# Patient Record
Sex: Female | Born: 1960 | Race: White | Hispanic: No | Smoking: Former smoker
Health system: Southern US, Community
[De-identification: ages and names within clinical notes are randomized; demographics above are authoritative.]

## PROBLEM LIST (undated history)

## (undated) DIAGNOSIS — K529 Noninfective gastroenteritis and colitis, unspecified: Secondary | ICD-10-CM

## (undated) DIAGNOSIS — K589 Irritable bowel syndrome without diarrhea: Secondary | ICD-10-CM

## (undated) HISTORY — PX: TONSILLECTOMY: SUR1361

## (undated) HISTORY — DX: Irritable bowel syndrome, unspecified: K58.9

## (undated) HISTORY — PX: TUBAL LIGATION: SHX77

## (undated) HISTORY — DX: Noninfective gastroenteritis and colitis, unspecified: K52.9

## (undated) HISTORY — PX: LAMINECTOMY: SHX219

---

## 2014-08-20 ENCOUNTER — Emergency Department (HOSPITAL_COMMUNITY): Payer: PRIVATE HEALTH INSURANCE

## 2014-08-20 ENCOUNTER — Emergency Department (HOSPITAL_COMMUNITY)
Admission: EM | Admit: 2014-08-20 | Discharge: 2014-08-20 | Disposition: A | Discharge status: Home / Self Care | Payer: PRIVATE HEALTH INSURANCE | Attending: Emergency Medicine | Admitting: Emergency Medicine

## 2014-08-20 ENCOUNTER — Encounter (HOSPITAL_COMMUNITY): Payer: Self-pay | Admitting: Emergency Medicine

## 2014-08-20 DIAGNOSIS — R21 Rash and other nonspecific skin eruption: Secondary | ICD-10-CM | POA: Diagnosis not present

## 2014-08-20 DIAGNOSIS — Z3202 Encounter for pregnancy test, result negative: Secondary | ICD-10-CM | POA: Diagnosis not present

## 2014-08-20 DIAGNOSIS — K644 Residual hemorrhoidal skin tags: Secondary | ICD-10-CM | POA: Insufficient documentation

## 2014-08-20 DIAGNOSIS — K625 Hemorrhage of anus and rectum: Secondary | ICD-10-CM | POA: Insufficient documentation

## 2014-08-20 DIAGNOSIS — R1030 Lower abdominal pain, unspecified: Secondary | ICD-10-CM

## 2014-08-20 DIAGNOSIS — K648 Other hemorrhoids: Secondary | ICD-10-CM | POA: Diagnosis not present

## 2014-08-20 DIAGNOSIS — R197 Diarrhea, unspecified: Secondary | ICD-10-CM

## 2014-08-20 DIAGNOSIS — K649 Unspecified hemorrhoids: Secondary | ICD-10-CM

## 2014-08-20 DIAGNOSIS — K863 Pseudocyst of pancreas: Secondary | ICD-10-CM | POA: Diagnosis not present

## 2014-08-20 DIAGNOSIS — R198 Other specified symptoms and signs involving the digestive system and abdomen: Secondary | ICD-10-CM | POA: Insufficient documentation

## 2014-08-20 DIAGNOSIS — R110 Nausea: Secondary | ICD-10-CM

## 2014-08-20 DIAGNOSIS — K529 Noninfective gastroenteritis and colitis, unspecified: Secondary | ICD-10-CM | POA: Insufficient documentation

## 2014-08-20 HISTORY — DX: Noninfective gastroenteritis and colitis, unspecified: K52.9

## 2014-08-20 LAB — COMPREHENSIVE METABOLIC PANEL
ALK PHOS: 73 U/L (ref 38–126)
ALT: 20 U/L (ref 14–54)
AST: 27 U/L (ref 15–41)
Albumin: 3.9 g/dL (ref 3.5–5.0)
Anion gap: 8 (ref 5–15)
BUN: 10 mg/dL (ref 6–20)
CALCIUM: 9.3 mg/dL (ref 8.9–10.3)
CO2: 29 mmol/L (ref 22–32)
Chloride: 105 mmol/L (ref 101–111)
Creatinine, Ser: 0.7 mg/dL (ref 0.44–1.00)
GFR calc non Af Amer: >60 mL/min (ref >60)
GLUCOSE: 106 mg/dL — AB (ref 65–99)
Potassium: 4.3 mmol/L (ref 3.5–5.1)
Sodium: 142 mmol/L (ref 135–145)
TOTAL PROTEIN: 6.8 g/dL (ref 6.5–8.1)
Total Bilirubin: 0.8 mg/dL (ref 0.3–1.2)

## 2014-08-20 LAB — URINALYSIS, ROUTINE W REFLEX MICROSCOPIC
Bilirubin Urine: NEGATIVE
Glucose, UA: NEGATIVE mg/dL
KETONES UR: NEGATIVE mg/dL
LEUKOCYTES UA: NEGATIVE
Nitrite: NEGATIVE
PROTEIN: NEGATIVE mg/dL
SPECIFIC GRAVITY, URINE: 1.008 (ref 1.005–1.030)
Urobilinogen, UA: 0.2 mg/dL (ref 0.0–1.0)
pH: 7 (ref 5.0–8.0)

## 2014-08-20 LAB — URINE MICROSCOPIC-ADD ON

## 2014-08-20 LAB — CBC WITH DIFFERENTIAL/PLATELET
BASOS ABS: 0 10*3/uL (ref 0.0–0.1)
BASOS PCT: 0 % (ref 0–1)
EOS ABS: 0 10*3/uL (ref 0.0–0.7)
Eosinophils Relative: 0 % (ref 0–5)
HEMATOCRIT: 42.6 % (ref 36.0–46.0)
HEMOGLOBIN: 14.6 g/dL (ref 12.0–15.0)
LYMPHS ABS: 1 10*3/uL (ref 0.7–4.0)
Lymphocytes Relative: 16 % (ref 12–46)
MCH: 33.1 pg (ref 26.0–34.0)
MCHC: 34.3 g/dL (ref 30.0–36.0)
MCV: 96.6 fL (ref 78.0–100.0)
MONO ABS: 0.3 10*3/uL (ref 0.1–1.0)
MONOS PCT: 6 % (ref 3–12)
Neutro Abs: 4.6 10*3/uL (ref 1.7–7.7)
Neutrophils Relative %: 78 % — ABNORMAL HIGH (ref 43–77)
Platelets: 212 10*3/uL (ref 150–400)
RBC: 4.41 MIL/uL (ref 3.87–5.11)
RDW: 12.6 % (ref 11.5–15.5)
WBC: 5.9 10*3/uL (ref 4.0–10.5)

## 2014-08-20 LAB — POC OCCULT BLOOD, ED: Fecal Occult Bld: POSITIVE — AB

## 2014-08-20 LAB — POC URINE PREG, ED: Preg Test, Ur: NEGATIVE

## 2014-08-20 LAB — LIPASE, BLOOD: LIPASE: 35 U/L (ref 22–51)

## 2014-08-20 MED ORDER — ONDANSETRON HCL 8 MG PO TABS: 8.0000 mg | ORAL_TABLET | Freq: Three times a day (TID) | ORAL | Status: DC | PRN

## 2014-08-20 MED ORDER — CIPROFLOXACIN HCL 500 MG PO TABS
500.0000 mg | ORAL_TABLET | Freq: Once | ORAL | Status: AC
  Administered 2014-08-20: 500 mg via ORAL
  Filled 2014-08-20: qty 1

## 2014-08-20 MED ORDER — METRONIDAZOLE 500 MG PO TABS: 500.0000 mg | ORAL_TABLET | Freq: Three times a day (TID) | ORAL | Status: AC

## 2014-08-20 MED ORDER — HYDROCODONE-ACETAMINOPHEN 5-325 MG PO TABS: 1.0000 | ORAL_TABLET | Freq: Four times a day (QID) | ORAL | Status: DC | PRN

## 2014-08-20 MED ORDER — DICYCLOMINE HCL 10 MG/ML IM SOLN: 20.0000 mg | Freq: Once | INTRAMUSCULAR | Status: DC

## 2014-08-20 MED ORDER — NAPROXEN 500 MG PO TABS: 500.0000 mg | ORAL_TABLET | Freq: Two times a day (BID) | ORAL | Status: DC | PRN

## 2014-08-20 MED ORDER — DICYCLOMINE HCL 20 MG PO TABS: 20.0000 mg | ORAL_TABLET | Freq: Two times a day (BID) | ORAL | Status: DC | PRN

## 2014-08-20 MED ORDER — CIPROFLOXACIN HCL 500 MG PO TABS: 500.0000 mg | ORAL_TABLET | Freq: Two times a day (BID) | ORAL | Status: AC

## 2014-08-20 MED ORDER — DICYCLOMINE HCL 10 MG PO CAPS
10.0000 mg | ORAL_CAPSULE | Freq: Once | ORAL | Status: AC
  Administered 2014-08-20: 10 mg via ORAL
  Filled 2014-08-20: qty 1

## 2014-08-20 MED ORDER — IOHEXOL 300 MG/ML  SOLN
100.0000 mL | Freq: Once | INTRAMUSCULAR | Status: AC | PRN
  Administered 2014-08-20: 100 mL via INTRAVENOUS

## 2014-08-20 MED ORDER — METRONIDAZOLE 500 MG PO TABS
500.0000 mg | ORAL_TABLET | Freq: Once | ORAL | Status: AC
  Administered 2014-08-20: 500 mg via ORAL
  Filled 2014-08-20: qty 1

## 2014-08-20 MED ORDER — MORPHINE SULFATE 4 MG/ML IJ SOLN
4.0000 mg | Freq: Once | INTRAMUSCULAR | Status: AC
  Administered 2014-08-20: 4 mg via INTRAVENOUS
  Filled 2014-08-20: qty 1

## 2014-08-20 MED ORDER — IOHEXOL 300 MG/ML  SOLN
25.0000 mL | Freq: Once | INTRAMUSCULAR | Status: AC | PRN
  Administered 2014-08-20: 25 mL via ORAL

## 2014-08-20 MED ORDER — SODIUM CHLORIDE 0.9 % IV BOLUS (SEPSIS)
1000.0000 mL | Freq: Once | INTRAVENOUS | Status: AC
  Administered 2014-08-20: 1000 mL via INTRAVENOUS

## 2014-08-20 MED ORDER — ONDANSETRON HCL 4 MG/2ML IJ SOLN
4.0000 mg | Freq: Once | INTRAMUSCULAR | Status: AC
  Administered 2014-08-20: 4 mg via INTRAVENOUS
  Filled 2014-08-20: qty 2

## 2014-08-20 MED ORDER — HYDROCORTISONE ACETATE 25 MG RE SUPP
25.0000 mg | Freq: Two times a day (BID) | RECTAL | Status: AC
Start: 2014-08-20 — End: ?

## 2014-08-20 NOTE — ED Notes (Signed)
NAD at this time. Pt is stable and going home.  

## 2014-08-20 NOTE — Discharge Instructions (Signed)
Your abdominal pain and diarrhea is caused by colitis, which is most likely infectious. Use zofran as prescribed, as needed for nausea. Stay well hydrated with small sips of fluids throughout the day. Follow a BRAT (banana-rice-applesauce-toast) diet as described below for the next 24-48 hours. The 'BRAT' diet is suggested, then progress to diet as tolerated as symptoms abate. Take cipro and flagyl as directed. Use norco and naprosyn as directed as needed for pain but don't drive or operate machinery while taking norco. Use anusol as directed to help with your hemorrhoids and rectal bleeding. Use bentyl as needed for abdominal spasms. For your rash, use over the counter cortisone cream as needed and avoid any known triggers. Follow up with [facility] in 1 week to establish medical care here and to follow up with your symptoms. Call your doctor if bloody stools, persistent diarrhea, vomiting, fever or abdominal pain. Return to ER for changing or worsening of symptoms.  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 Your CT results have been attached to this packet.  Food Choices to Help Relieve Diarrhea When you have diarrhea, the foods you eat and your eating habits are very important. Choosing the right foods and drinks can help relieve diarrhea. Also, because diarrhea can last up to 7 days, you need to replace lost fluids and electrolytes (such as sodium, potassium, and chloride) in order to help prevent dehydration.  WHAT GENERAL GUIDELINES DO I NEED TO FOLLOW?  Slowly drink 1 cup (8 oz) of fluid for each episode of diarrhea. If you are getting enough fluid, your urine will be clear or pale yellow.  Eat starchy foods. Some good choices include white rice, white toast, pasta, low-fiber cereal, baked potatoes (without the skin), saltine crackers, and bagels.  Avoid large servings of any cooked vegetables.  Limit fruit  to two servings per day. A serving is  cup or 1 small piece.  Choose foods with less than 2 g of fiber per serving.  Limit fats to less than 8 tsp (38 g) per day.  Avoid fried foods.  Eat foods that have probiotics in them. Probiotics can be found in certain dairy products.  Avoid foods and beverages that may increase the speed at which food moves through the stomach and intestines (gastrointestinal tract). Things to avoid include:  High-fiber foods, such as dried fruit, raw fruits and vegetables, nuts, seeds, and whole grain foods.  Spicy foods and high-fat foods.  Foods and beverages sweetened with high-fructose corn syrup, honey, or sugar alcohols such as xylitol, sorbitol, and mannitol. WHAT FOODS ARE RECOMMENDED? Grains White rice. White, [place], or pita breads (fresh or toasted), including plain rolls, buns, or bagels. White pasta. Saltine, soda, or graham crackers. Pretzels. Low-fiber cereal. Cooked cereals made with water (such as cornmeal, farina, or cream cereals). Plain muffins. Matzo. Melba toast. Zwieback.  Vegetables Potatoes (without the skin). Strained tomato and vegetable juices. Most well-cooked and canned vegetables without seeds. Tender lettuce. Fruits Cooked or canned applesauce, apricots, cherries, fruit cocktail, grapefruit, peaches, pears, or plums. Fresh bananas, apples without skin, cherries, grapes, cantaloupe, grapefruit, peaches, oranges, or plums.  Meat and Other Protein Products Baked or boiled chicken. Eggs. Tofu. Fish. Seafood. Smooth peanut butter. Ground or well-cooked tender beef, ham, veal, lamb, pork, or poultry.  Dairy Plain yogurt, kefir, and unsweetened liquid yogurt. Lactose-free milk, buttermilk, or soy milk. Plain hard cheese. Beverages  Sport drinks. Clear broths. Diluted fruit juices (except prune). Regular, caffeine-free sodas such as ginger ale. Water. Decaffeinated teas. Oral rehydration solutions. Sugar-free beverages not sweetened with  sugar alcohols. Other Bouillon, broth, or soups made from recommended foods.  The items listed above may not be a complete list of recommended foods or beverages. Contact your dietitian for more options. WHAT FOODS ARE NOT RECOMMENDED? Grains Whole grain, whole wheat, bran, or rye breads, rolls, pastas, crackers, and cereals. Wild or brown rice. Cereals that contain more than 2 g of fiber per serving. Corn tortillas or taco shells. Cooked or dry oatmeal. Granola. Popcorn. Vegetables Raw vegetables. Cabbage, broccoli, Brussels sprouts, artichokes, baked beans, beet greens, corn, kale, legumes, peas, sweet potatoes, and yams. Potato skins. Cooked spinach and cabbage. Fruits Dried fruit, including raisins and dates. Raw fruits. Stewed or dried prunes. Fresh apples with skin, apricots, mangoes, pears, raspberries, and strawberries.  Meat and Other Protein Products Chunky peanut butter. Nuts and seeds. Beans and lentils. Berniece Salines.  Dairy High-fat cheeses. Milk, chocolate milk, and beverages made with milk, such as milk shakes. Cream. Ice cream. Sweets and Desserts Sweet rolls, doughnuts, and sweet breads. Pancakes and waffles. Fats and Oils Butter. Cream sauces. Margarine. Salad oils. Plain salad dressings. Olives. Avocados.  Beverages Caffeinated beverages (such as coffee, tea, soda, or energy drinks). Alcoholic beverages. Fruit juices with pulp. Prune juice. Soft drinks sweetened with high-fructose corn syrup or sugar alcohols. Other Coconut. Hot sauce. Chili powder. Mayonnaise. Gravy. Cream-based or milk-based soups.  The items listed above may not be a complete list of foods and beverages to avoid. Contact your dietitian for more information. WHAT SHOULD I DO IF I BECOME DEHYDRATED? Diarrhea can sometimes lead to dehydration. Signs of dehydration include dark urine and dry mouth and skin. If you think you are dehydrated, you should rehydrate with an oral rehydration solution. These solutions can  be purchased at pharmacies, retail stores, or online.  Drink -1 cup (120-240 mL) of oral rehydration solution each time you have an episode of diarrhea. If drinking this amount makes your diarrhea worse, try drinking smaller amounts more often. For example, drink 1-3 tsp (5-15 mL) every 5-10 minutes.  A general rule for staying hydrated is to drink 1-2 L of fluid per day. Talk to your health care provider about the specific amount you should be drinking each day. Drink enough fluids to keep your urine clear or pale yellow. Document Released: 04/25/2003 Document Revised: 02/07/2013 Document Reviewed: 12/26/2012 [facility] Patient Information 2015 [place], [place]. This information is not intended to replace advice given to you by your health care provider. Make sure you discuss any questions you have with your health care provider.    Abdominal Pain, Women Abdominal (stomach, pelvic, or belly) pain can be caused by many things. It is important to tell your doctor:  The location of the pain.  Does it come and go or is it present all the time?  Are there things that start the pain (eating certain foods, exercise)?  Are there other symptoms associated with the pain (fever, nausea, vomiting, diarrhea)? All of this is helpful to know when trying to find the cause of the pain. CAUSES   Stomach: virus or bacteria infection, or ulcer.  Intestine: appendicitis (inflamed appendix), regional ileitis (Crohn's disease), ulcerative colitis (inflamed colon), irritable bowel syndrome, diverticulitis (inflamed diverticulum of the colon), or cancer of the stomach or intestine.  Gallbladder disease or stones in the gallbladder.  Kidney disease, kidney stones, or infection.  Pancreas infection or cancer.  Fibromyalgia (pain disorder).  Diseases of the female organs:  Uterus: fibroid (non-cancerous) tumors or infection.  Fallopian tubes: infection or tubal pregnancy.  Ovary: cysts or tumors.  Pelvic  adhesions (scar tissue).  Endometriosis (uterus lining tissue growing in the pelvis and on the pelvic organs).  Pelvic congestion syndrome (female organs filling up with blood just before the menstrual period).  Pain with the menstrual period.  Pain with ovulation (producing an egg).  Pain with an IUD (intrauterine device, birth control) in the uterus.  Cancer of the female organs.  Functional pain (pain not caused by a disease, may improve without treatment).  Psychological pain.  Depression. DIAGNOSIS  Your doctor will decide the seriousness of your pain by doing an examination.  Blood tests.  X-rays.  Ultrasound.  CT scan (computed tomography, special type of X-ray).  MRI (magnetic resonance imaging).  Cultures, for infection.  Barium enema (dye inserted in the large intestine, to better view it with X-rays).  Colonoscopy (looking in intestine with a lighted tube).  Laparoscopy (minor surgery, looking in abdomen with a lighted tube).  Major abdominal exploratory surgery (looking in abdomen with a large incision). TREATMENT  The treatment will depend on the cause of the pain.   Many cases can be observed and treated at home.  Over-the-counter medicines recommended by your caregiver.  Prescription medicine.  Antibiotics, for infection.  Birth control pills, for painful periods or for ovulation pain.  Hormone treatment, for endometriosis.  Nerve blocking injections.  Physical therapy.  Antidepressants.  Counseling with a psychologist or psychiatrist.  Minor or major surgery. HOME CARE INSTRUCTIONS   Do not take laxatives, unless directed by your caregiver.  Take over-the-counter pain medicine only if ordered by your caregiver. Do not take aspirin because it can cause an upset stomach or bleeding.  Try a clear liquid diet (broth or water) as ordered by your caregiver. Slowly move to a bland diet, as tolerated, if the pain is related to the stomach  or intestine.  Have a thermometer and take your temperature several times a day, and record it.  Bed rest and sleep, if it helps the pain.  Avoid sexual intercourse, if it causes pain.  Avoid stressful situations.  Keep your follow-up appointments and tests, as your caregiver orders.  If the pain does not go away with medicine or surgery, you may try:  Acupuncture.  Relaxation exercises (yoga, meditation).  Group therapy.  Counseling. SEEK MEDICAL CARE IF:   You notice certain foods cause stomach pain.  Your home care treatment is not helping your pain.  You need stronger pain medicine.  You want your IUD removed.  You feel faint or lightheaded.  You develop nausea and vomiting.  You develop a rash.  You are having side effects or an allergy to your medicine. SEEK IMMEDIATE MEDICAL CARE IF:   Your pain does not go away or gets worse.  You have a fever.  Your pain is felt only in portions of the abdomen. The right side could possibly be appendicitis. The left lower portion of the abdomen could be colitis or diverticulitis.  You are passing blood in your stools (bright red or black tarry stools, with or without vomiting).  You have blood in your urine.  You develop chills, with or without a fever.  You pass out. MAKE SURE YOU:   Understand these instructions.  Will watch your condition.  Will get help right away if you are not  doing well or get worse. Document Released: 11/30/2006 Document Revised: 06/19/2013 Document Reviewed: 12/20/2008 [facility] Patient Information 2015 [place], [place]. This information is not intended to replace advice given to you by your health care provider. Make sure you discuss any questions you have with your health care provider.  Colitis Colitis is inflammation of the colon. Colitis can be a short-term or long-standing (chronic) illness. Crohn's disease and ulcerative colitis are 2 types of colitis which are chronic. They  usually require lifelong treatment. CAUSES  There are many different causes of colitis, including:  Viruses.  Germs (bacteria).  Medicine reactions. SYMPTOMS   Diarrhea.  Intestinal bleeding.  Pain.  Fever.  Throwing up (vomiting).  Tiredness (fatigue).  Weight loss.  Bowel blockage. DIAGNOSIS  The diagnosis of colitis is based on examination and stool or blood tests. X-rays, CT scan, and colonoscopy may also be needed. TREATMENT  Treatment may include:  Fluids given through the vein (intravenously).  Bowel rest (nothing to eat or drink for a period of time).  Medicine for pain and diarrhea.  Medicines (antibiotics) that kill germs.  Cortisone medicines.  Surgery. HOME CARE INSTRUCTIONS   Get plenty of rest.  Drink enough water and fluids to keep your urine clear or pale yellow.  Eat a well-balanced diet.  Call your caregiver for follow-up as recommended. SEEK IMMEDIATE MEDICAL CARE IF:   You develop chills.  You have an oral temperature above 102 F (38.9 C), not controlled by medicine.  You have extreme weakness, fainting, or dehydration.  You have repeated vomiting.  You develop severe belly (abdominal) pain or are passing bloody or tarry stools. MAKE SURE YOU:   Understand these instructions.  Will watch your condition.  Will get help right away if you are not doing well or get worse. Document Released: 03/12/2004 Document Revised: 04/27/2011 Document Reviewed: 06/07/2009 [facility] Patient Information 2015 [place], [place]. This information is not intended to replace advice given to you by your health care provider. Make sure you discuss any questions you have with your health care provider.  Diarrhea Diarrhea is frequent loose and watery bowel movements. It can cause you to feel weak and dehydrated. Dehydration can cause you to become tired and thirsty, have a dry mouth, and have decreased urination that often is dark yellow. Diarrhea is a  sign of another problem, most often an infection that will not last long. In most cases, diarrhea typically lasts 2-3 days. However, it can last longer if it is a sign of something more serious. It is important to treat your diarrhea as directed by your caregiver to lessen or prevent future episodes of diarrhea. CAUSES  Some common causes include:  Gastrointestinal infections caused by viruses, bacteria, or parasites.  Food poisoning or food allergies.  Certain medicines, such as antibiotics, chemotherapy, and laxatives.  Artificial sweeteners and fructose.  Digestive disorders. HOME CARE INSTRUCTIONS  Ensure adequate fluid intake (hydration): Have 1 cup (8 oz) of fluid for each diarrhea episode. Avoid fluids that contain simple sugars or sports drinks, fruit juices, whole milk products, and sodas. Your urine should be clear or pale yellow if you are drinking enough fluids. Hydrate with an oral rehydration solution that you can purchase at pharmacies, retail stores, and online. You can prepare an oral rehydration solution at home by mixing the following ingredients together:   - tsp table salt.   tsp baking soda.   tsp salt substitute containing potassium chloride.  1  tablespoons sugar.  1 L (34  oz) of water.  Certain foods and beverages may increase the speed at which food moves through the gastrointestinal (GI) tract. These foods and beverages should be avoided and include:  Caffeinated and alcoholic beverages.  High-fiber foods, such as raw fruits and vegetables, nuts, seeds, and whole grain breads and cereals.  Foods and beverages sweetened with sugar alcohols, such as xylitol, sorbitol, and mannitol.  Some foods may be well tolerated and may help thicken stool including:  Starchy foods, such as rice, toast, pasta, low-sugar cereal, oatmeal, grits, baked potatoes, crackers, and bagels.  Bananas.  Applesauce.  Add probiotic-rich foods to help increase healthy bacteria  in the GI tract, such as yogurt and fermented milk products.  Wash your hands well after each diarrhea episode.  Only take over-the-counter or prescription medicines as directed by your caregiver.  Take a warm bath to relieve any burning or pain from frequent diarrhea episodes. SEEK IMMEDIATE MEDICAL CARE IF:   You are unable to keep fluids down.  You have persistent vomiting.  You have blood in your stool, or your stools are black and tarry.  You do not urinate in 6-8 hours, or there is only a small amount of very dark urine.  You have abdominal pain that increases or localizes.  You have weakness, dizziness, confusion, or light-headedness.  You have a severe headache.  Your diarrhea gets worse or does not get better.  You have a fever or persistent symptoms for more than 2-3 days.  You have a fever and your symptoms suddenly get worse. MAKE SURE YOU:   Understand these instructions.  Will watch your condition.  Will get help right away if you are not doing well or get worse. Document Released: 01/23/2002 Document Revised: 06/19/2013 Document Reviewed: 10/11/2011 [facility] Patient Information 2015 [place], [place]. This information is not intended to replace advice given to you by your health care provider. Make sure you discuss any questions you have with your health care provider.  Hemorrhoids Hemorrhoids are swollen veins around the rectum or anus. There are two types of hemorrhoids:   Internal hemorrhoids. These occur in the veins just inside the rectum. They may poke through to the outside and become irritated and painful.  External hemorrhoids. These occur in the veins outside the anus and can be felt as a painful swelling or hard lump near the anus. CAUSES  Pregnancy.   Obesity.   Constipation or diarrhea.   Straining to have a bowel movement.   Sitting for long periods on the toilet.  Heavy lifting or other activity that caused you to strain.  Anal  intercourse. SYMPTOMS   Pain.   Anal itching or irritation.   Rectal bleeding.   Fecal leakage.   Anal swelling.   One or more lumps around the anus.  DIAGNOSIS  Your caregiver may be able to diagnose hemorrhoids by visual examination. Other examinations or tests that may be performed include:   Examination of the rectal area with a gloved hand (digital rectal exam).   Examination of anal canal using a small tube (scope).   A blood test if you have lost a significant amount of blood.  A test to look inside the colon (sigmoidoscopy or colonoscopy). TREATMENT Most hemorrhoids can be treated at home. However, if symptoms do not seem to be getting better or if you have a lot of rectal bleeding, your caregiver may perform a procedure to help make the hemorrhoids get smaller or remove them completely. Possible treatments include:  Placing a rubber band at the base of the hemorrhoid to cut off the circulation (rubber band ligation).   Injecting a chemical to shrink the hemorrhoid (sclerotherapy).   Using a tool to burn the hemorrhoid (infrared light therapy).   Surgically removing the hemorrhoid (hemorrhoidectomy).   Stapling the hemorrhoid to block blood flow to the tissue (hemorrhoid stapling).  HOME CARE INSTRUCTIONS   Eat foods with fiber, such as whole grains, beans, nuts, fruits, and vegetables. Ask your doctor about taking products with added fiber in them (fibersupplements).  Increase fluid intake. Drink enough water and fluids to keep your urine clear or pale yellow.   Exercise regularly.   Go to the bathroom when you have the urge to have a bowel movement. Do not wait.   Avoid straining to have bowel movements.   Keep the anal area dry and clean. Use wet toilet paper or moist towelettes after a bowel movement.   Medicated creams and suppositories may be used or applied as directed.   Only take over-the-counter or prescription medicines as  directed by your caregiver.   Take warm sitz baths for 15-20 minutes, 3-4 times a day to ease pain and discomfort.   Place ice packs on the hemorrhoids if they are tender and swollen. Using ice packs between sitz baths may be helpful.   Put ice in a plastic bag.   Place a towel between your skin and the bag.   Leave the ice on for 15-20 minutes, 3-4 times a day.   Do not use a donut-shaped pillow or sit on the toilet for long periods. This increases blood pooling and pain.  SEEK MEDICAL CARE IF:  You have increasing pain and swelling that is not controlled by treatment or medicine.  You have uncontrolled bleeding.  You have difficulty or you are unable to have a bowel movement.  You have pain or inflammation outside the area of the hemorrhoids. MAKE SURE YOU:  Understand these instructions.  Will watch your condition.  Will get help right away if you are not doing well or get worse. Document Released: 01/31/2000 Document Revised: 01/20/2012 Document Reviewed: 12/08/2011 [facility] Patient Information 2015 [place], [place]. This information is not intended to replace advice given to you by your health care provider. Make sure you discuss any questions you have with your health care provider.

## 2014-08-20 NOTE — ED Notes (Signed)
Patient states ate heavy dinner last night, started having abdominal pain and then had rectal bleeding and mucous x 12 times.   Patient states has eased up, but still having some bleeding.   Patient states 5/10 abdominal pain at this time.   Patient denies fever.  Patient states also has rash on face with itching.

## 2014-08-20 NOTE — ED Provider Notes (Signed)
CSN: [id number]     Arrival date & time 08/20/14  1129 History   First MD Initiated Contact with Patient 08/20/14 1151     Chief Complaint  Patient presents with  . Rectal Bleeding  . Abdominal Pain     (Consider location/radiation/quality/duration/timing/severity/associated sxs/prior Treatment) HPI Comments: Caitlin Page is a 54 y.o. female with a PMHx of IBS, who presents to the ED with complaints of abdominal pain which is an ongoing for several days, but worsened yesterday around 7:30 PM after she ate steak shrimp potato salad and a watermelon margarita at [employer]. She reports the pain is 7/10 constant lower abdominal cramping pain intermittently radiating to the left flank, worse with the feeling of needing to have a bowel movement, and improved mildly with ibuprofen and Tylenol. Associated symptoms include mild nausea, tenesmus, 2 episodes of diarrhea, 12 episodes of mucus and bright red blood passage which is overall improving. Additionally she states that she has an itchy rash on her face, no new medications or sick contacts, and no plant or animal contacts, but she moved into a new apartment yesterday and did a deep cleaning with multiple cleaning supplies. She denies any fevers, chills, chest pain, shortness breath, vomiting, constipation, obstipation, rectal pain, dysuria, hematuria, vaginal bleeding or discharge, numbness, tingling, weakness, or lightheadedness. She denies any recent travel, sick contacts, suspicious food intake, alcohol use regularly, NSAIDs, or antibiotic use.  Patient is a 54 y.o. female presenting with hematochezia and abdominal pain. The history is provided by the patient. No language interpreter was used.  Rectal Bleeding Quality:  Bright red Amount:  Moderate Duration:  1 day Timing:  Constant Progression:  Improving Chronicity:  New Context: defecation, diarrhea and spontaneously   Context: not rectal pain   Relieved by:  None tried Worsened  by:  Nothing tried Ineffective treatments:  None tried Associated symptoms: abdominal pain   Associated symptoms: no dizziness, no fever, no light-headedness and no vomiting   Risk factors: no NSAID use   Risk factors comment:  +hx of IBS Abdominal Pain Associated symptoms: hematochezia and nausea   Associated symptoms: no chest pain, no chills, no constipation, no diarrhea, no dysuria, no fever, no hematuria, no shortness of breath, no vaginal bleeding, no vaginal discharge and no vomiting     History reviewed. No pertinent past medical history. History reviewed. No pertinent past surgical history. No family history on file. History  Substance Use Topics  . Smoking status: Former Research scientist (life sciences)  . Smokeless tobacco: Not on file  . Alcohol Use: Yes   OB History    No data available     Review of Systems  Constitutional: Negative for fever and chills.  Respiratory: Negative for shortness of breath.   Cardiovascular: Negative for chest pain.  Gastrointestinal: Positive for nausea, abdominal pain, blood in stool, hematochezia and anal bleeding. Negative for vomiting, diarrhea, constipation and rectal pain.       +tenesmus  Genitourinary: Positive for flank pain. Negative for dysuria, hematuria, vaginal bleeding and vaginal discharge.  Musculoskeletal: Negative for myalgias and arthralgias.  Skin: Positive for rash.  Allergic/Immunologic: Negative for immunocompromised state.  Neurological: Negative for dizziness, weakness, light-headedness and numbness.  Psychiatric/Behavioral: Negative for confusion.   10 Systems reviewed and are negative for acute change except as noted in the HPI.    Allergies  Compazine  Home Medications   Prior to Admission medications   Not on File   BP 127/63 mmHg  Pulse 85  Temp(Src) 98.5  F (36.9 C) (Oral)  Resp 18  Ht 5\' 11"  (1.803 m)  Wt 152 lb (68.947 kg)  BMI 21.21 kg/m2  SpO2 96% Physical Exam  Constitutional: She is oriented to person,  place, and time. Vital signs are normal. She appears well-developed and well-nourished.  Non-toxic appearance. No distress.  Afebrile, nontoxic, NAD  HENT:  Head: Normocephalic and atraumatic.  Mouth/Throat: Oropharynx is clear and moist and mucous membranes are normal.  Eyes: Conjunctivae and EOM are normal. Right eye exhibits no discharge. Left eye exhibits no discharge.  Neck: Normal range of motion. Neck supple.  Cardiovascular: Normal rate, regular rhythm, normal heart sounds and intact distal pulses.  Exam reveals no gallop and no friction rub.   No murmur heard. Pulmonary/Chest: Effort normal and breath sounds normal. No respiratory distress. She has no decreased breath sounds. She has no wheezes. She has no rhonchi. She has no rales.  Abdominal: Soft. Normal appearance and bowel sounds are normal. She exhibits no distension. There is tenderness in the right lower quadrant, suprapubic area and left lower quadrant. There is tenderness at McBurney's point. There is no rigidity, no rebound, no guarding, no CVA tenderness and negative Murphy's sign.    Soft, nondistended, +BS throughout, with diffuse lower abdominal tenderness including over mcburney's point, no r/g/r, neg murphy's, no CVA TTP   Genitourinary: Rectal exam shows external hemorrhoid, internal hemorrhoid and tenderness. Rectal exam shows no fissure, no mass and anal tone normal. Guaiac positive stool. Pelvic exam was performed with patient in the knee-chest position.  Chaperone present No gross blood noted on rectal exam, no stool in rectal vault, normal tone, mild tenderness anteriorly in rectal vault, no mass or fissure, old ext hemorrhoidal skin tag, palpable internal hemorrhoid. FOBT+   Musculoskeletal: Normal range of motion.  Neurological: She is alert and oriented to person, place, and time. She has normal strength. No sensory deficit.  Skin: Skin is warm, dry and intact. Rash noted. Rash is urticarial.  Fine urticarial  rash across face, mildly erythematous without warmth. No drainage or induration.   Psychiatric: She has a normal mood and affect.  Nursing note and vitals reviewed.   ED Course  Procedures (including critical care time) Labs Review Labs Reviewed  CBC WITH DIFFERENTIAL/PLATELET - Abnormal; Notable for the following:    Neutrophils Relative % 78 (*)    All other components within normal limits  COMPREHENSIVE METABOLIC PANEL - Abnormal; Notable for the following:    Glucose, Bld 106 (*)    All other components within normal limits  URINALYSIS, ROUTINE W REFLEX MICROSCOPIC (NOT AT [facility]) - Abnormal; Notable for the following:    Hgb urine dipstick TRACE (*)    All other components within normal limits  URINE MICROSCOPIC-ADD ON - Abnormal; Notable for the following:    Squamous Epithelial / LPF FEW (*)    All other components within normal limits  POC OCCULT BLOOD, ED - Abnormal; Notable for the following:    Fecal Occult Bld POSITIVE (*)    All other components within normal limits  LIPASE, BLOOD  POC URINE PREG, ED    Imaging Review Ct Abdomen Pelvis W Contrast  08/20/2014   CLINICAL DATA:  Lower abdominal pain and rectal bleeding beginning last night.  EXAM: CT ABDOMEN AND PELVIS WITH CONTRAST  TECHNIQUE: Multidetector CT imaging of the abdomen and pelvis was performed using the standard protocol following bolus administration of intravenous contrast.  CONTRAST:  163mL OMNIPAQUE IOHEXOL 300 MG/ML  SOLN  COMPARISON:  None.  FINDINGS: There is an 8 mm rounded low-density focus within the posterior segment the right lobe of the liver likely to represent a cyst. This was not fully imaged on the delayed scan. No other liver finding. The spleen is normal. The pancreas shows a pancreas divisum ductal pattern. The ductal system is slightly prominent, suggesting previous pancreatitis. Within the distal body, there is a 6 mm low-density that probably represents a small pseudocyst. Follow-up in 1 year  with MRI is recommended to ensure this is not enlarging, possibly representing a cystic pancreatic neoplasm.  The adrenal glands are normal. The left kidney is normal. The right kidney is incompletely rotated. No cyst, mass, stone or hydronephrosis. There are a few phleboliths in retroperitoneal veins on the right, not significant. The appendix is normal. The aorta and IVC are normal. The right colon and transverse colon are normal. In the descending colon, there is mild wall thickening extending over 8-10 cm. This suggests colitis and could be due to inflammatory bowel disease or infection. The uterus is normal. No adnexal lesion. No bladder abnormality. Previous lumbosacral fusion has a good appearance.  IMPRESSION: Wall thickening of the descending colon over a length of 8-10 cm consistent with colitis. This could be due to infection or inflammatory bowel disease.  Prominence of the pancreatic ductal system. Pancreas divisum. Question history of pancreatitis previously. 6 mm low-density in the distal pancreatic body that probably represents a pseudocyst. MRI follow-up in 1 year is recommended to ensure that this does not represent an enlarging cystic neoplasm.   Electronically Signed   By: Nelson Chimes M.D.   On: 08/20/2014 14:19     EKG Interpretation None      MDM   Final diagnoses:  Lower abdominal pain  Hemorrhoids, unspecified hemorrhoid type  Rectal bleeding  Colitis  Diarrhea  Tenesmus (rectal)  Nausea  Facial rash  Pseudocyst of pancreas    54 y.o. female here with lower abd pain for several days acutely worsening last night after a heavy meal. Associated symptoms of diarrhea x2 and rectal bleeding/mucous passage x12 episodes, improving now but still ongoing. Some nausea and tenesmus. Hx of IBS, used to be on levsin. Could be IBS vs diverticulitis vs rectal abscess. On exam, external hemorrhoidal tags, palpable internal hemorrhoid, no stool in rectal vault but FOBT card sent. Will  get labs and give fluids, nausea meds, pain meds, and bentyl. Will reassess shortly.   2:03 PM Upreg neg, U/A with few squamous and rare bacteria, nitrite/leuk neg. FOBT+. CBC w/diff unremarkable. CMP unremarkable. Lipase WNL. Awaiting CT. Will reassess shortly.  2:47 PM CT with wall thickening of descending colon c/w colitis. Also showing 13mm pseudocyst and pancreas divisum, pt without hx of pancreatitis to her knowledge, discussed these findings and advised f/up within 3yr for MRI as recommended per the radiologist. Pain improving, nausea improving. Will PO challenge with cipro/flagyl and then d/c home with cipro/flagyl, zofran, pain meds, bentyl, and anusol for her hemorrhoids. Discussed BRAT diet and f/up with [place] in 1wk to establish care and recheck symptoms.   3:35 PM Tolerating PO well after ~41mins from being given cipro/flagyl. Will d/c home with previously discussed plan. I explained the diagnosis and have given explicit precautions to return to the ER including for any other new or worsening symptoms. The patient understands and accepts the medical plan as it's been dictated and I have answered their questions. Discharge instructions concerning home care and prescriptions have been given. The  patient is STABLE and is discharged to home in good condition.  BP 109/60 mmHg  Pulse 69  Temp(Src) 98.5 F (36.9 C) (Oral)  Resp 16  Ht 5\' 11"  (1.803 m)  Wt 152 lb (68.947 kg)  BMI 21.21 kg/m2  SpO2 99%  LMP  (LMP Unknown)  Meds ordered this encounter  Medications  . sodium chloride 0.9 % bolus 1,000 mL    Sig:   . ondansetron (ZOFRAN) injection 4 mg    Sig:   . morphine 4 MG/ML injection 4 mg    Sig:   . iohexol (OMNIPAQUE) 300 MG/ML solution 25 mL    Sig:   . dicyclomine (BENTYL) capsule 10 mg    Sig:   . iohexol (OMNIPAQUE) 300 MG/ML solution 100 mL    Sig:   . ciprofloxacin (CIPRO) tablet 500 mg    Sig:   . metroNIDAZOLE (FLAGYL) tablet 500 mg    Sig:   . ciprofloxacin  (CIPRO) 500 MG tablet    Sig: Take 1 tablet (500 mg total) by mouth 2 (two) times daily. One po bid x 7 days    Dispense:  14 tablet    Refill:  0    Order Specific Question:  Supervising Provider    Answer:  Sabra Heck, BRIAN [[id number]]  . metroNIDAZOLE (FLAGYL) 500 MG tablet    Sig: Take 1 tablet (500 mg total) by mouth 3 (three) times daily. One po tid x 7 days    Dispense:  21 tablet    Refill:  0    Order Specific Question:  Supervising Provider    Answer:  Sabra Heck, BRIAN [[id number]]  . ondansetron (ZOFRAN) 8 MG tablet    Sig: Take 1 tablet (8 mg total) by mouth every 8 (eight) hours as needed for nausea or vomiting.    Dispense:  10 tablet    Refill:  0    Order Specific Question:  Supervising Provider    Answer:  Sabra Heck, BRIAN [[id number]]  . HYDROcodone-acetaminophen (NORCO) 5-325 MG per tablet    Sig: Take 1 tablet by mouth every 6 (six) hours as needed for severe pain.    Dispense:  20 tablet    Refill:  0    Order Specific Question:  Supervising Provider    Answer:  MILLER, BRIAN [[id number]]  . naproxen (NAPROSYN) 500 MG tablet    Sig: Take 1 tablet (500 mg total) by mouth 2 (two) times daily as needed for mild pain, moderate pain or headache (TAKE WITH MEALS.).    Dispense:  20 tablet    Refill:  0    Order Specific Question:  Supervising Provider    Answer:  MILLER, BRIAN [[id number]]  . hydrocortisone (ANUSOL-HC) 25 MG suppository    Sig: Place 1 suppository (25 mg total) rectally 2 (two) times daily. For 7 days    Dispense:  14 suppository    Refill:  0    Order Specific Question:  Supervising Provider    Answer:  MILLER, BRIAN [[id number]]  . dicyclomine (BENTYL) 20 MG tablet    Sig: Take 1 tablet (20 mg total) by mouth 2 (two) times daily as needed for spasms.    Dispense:  14 tablet    Refill:  0    Order Specific Question:  Supervising Provider    Answer:  Noemi Chapel [id number] [place], PA-C 08/20/14 1535  Orlie Dakin, MD 08/20/14 1737

## 2014-08-31 ENCOUNTER — Encounter: Payer: Self-pay | Admitting: Internal Medicine

## 2015-03-08 ENCOUNTER — Other Ambulatory Visit: Payer: Self-pay | Admitting: Gastroenterology

## 2015-03-08 DIAGNOSIS — K862 Cyst of pancreas: Secondary | ICD-10-CM

## 2015-03-12 DIAGNOSIS — Z1329 Encounter for screening for other suspected endocrine disorder: Secondary | ICD-10-CM | POA: Diagnosis not present

## 2015-03-12 DIAGNOSIS — Z13220 Encounter for screening for lipoid disorders: Secondary | ICD-10-CM | POA: Diagnosis not present

## 2015-03-12 DIAGNOSIS — Z13820 Encounter for screening for osteoporosis: Secondary | ICD-10-CM | POA: Diagnosis not present

## 2015-03-12 DIAGNOSIS — Z118 Encounter for screening for other infectious and parasitic diseases: Secondary | ICD-10-CM | POA: Diagnosis not present

## 2015-03-12 DIAGNOSIS — Z1231 Encounter for screening mammogram for malignant neoplasm of breast: Secondary | ICD-10-CM | POA: Diagnosis not present

## 2015-03-12 DIAGNOSIS — Z1321 Encounter for screening for nutritional disorder: Secondary | ICD-10-CM | POA: Diagnosis not present

## 2015-03-15 ENCOUNTER — Ambulatory Visit
Admission: RE | Admit: 2015-03-15 | Discharge: 2015-03-15 | Disposition: A | Discharge status: Home / Self Care | Payer: 59 | Source: Ambulatory Visit | Attending: Gastroenterology | Admitting: Gastroenterology

## 2015-03-15 DIAGNOSIS — K862 Cyst of pancreas: Secondary | ICD-10-CM | POA: Diagnosis not present

## 2015-03-15 MED ORDER — GADOBENATE DIMEGLUMINE 529 MG/ML IV SOLN
14.0000 mL | Freq: Once | INTRAVENOUS | Status: AC | PRN
  Administered 2015-03-15: 14 mL via INTRAVENOUS

## 2015-04-05 ENCOUNTER — Other Ambulatory Visit: Payer: Self-pay | Admitting: Gastroenterology

## 2015-04-05 DIAGNOSIS — Z86010 Personal history of colonic polyps: Secondary | ICD-10-CM | POA: Diagnosis not present

## 2015-04-05 DIAGNOSIS — D120 Benign neoplasm of cecum: Secondary | ICD-10-CM | POA: Diagnosis not present

## 2015-04-05 DIAGNOSIS — K644 Residual hemorrhoidal skin tags: Secondary | ICD-10-CM | POA: Diagnosis not present

## 2015-04-05 DIAGNOSIS — D123 Benign neoplasm of transverse colon: Secondary | ICD-10-CM | POA: Diagnosis not present

## 2015-04-05 DIAGNOSIS — R933 Abnormal findings on diagnostic imaging of other parts of digestive tract: Secondary | ICD-10-CM | POA: Diagnosis not present

## 2015-04-05 DIAGNOSIS — D124 Benign neoplasm of descending colon: Secondary | ICD-10-CM | POA: Diagnosis not present

## 2015-04-15 DIAGNOSIS — Z86010 Personal history of colonic polyps: Secondary | ICD-10-CM | POA: Diagnosis not present

## 2015-04-15 DIAGNOSIS — K639 Disease of intestine, unspecified: Secondary | ICD-10-CM | POA: Diagnosis not present

## 2015-04-15 DIAGNOSIS — K589 Irritable bowel syndrome without diarrhea: Secondary | ICD-10-CM | POA: Diagnosis not present

## 2015-04-15 DIAGNOSIS — K862 Cyst of pancreas: Secondary | ICD-10-CM | POA: Diagnosis not present

## 2015-05-07 DIAGNOSIS — M25511 Pain in right shoulder: Secondary | ICD-10-CM | POA: Diagnosis not present

## 2015-05-07 DIAGNOSIS — M7551 Bursitis of right shoulder: Secondary | ICD-10-CM | POA: Diagnosis not present

## 2015-06-25 DIAGNOSIS — Z113 Encounter for screening for infections with a predominantly sexual mode of transmission: Secondary | ICD-10-CM | POA: Diagnosis not present

## 2015-07-26 DIAGNOSIS — F3289 Other specified depressive episodes: Secondary | ICD-10-CM | POA: Diagnosis not present

## 2015-07-31 DIAGNOSIS — F3289 Other specified depressive episodes: Secondary | ICD-10-CM | POA: Diagnosis not present

## 2015-08-16 DIAGNOSIS — F3289 Other specified depressive episodes: Secondary | ICD-10-CM | POA: Diagnosis not present

## 2015-09-10 DIAGNOSIS — F3289 Other specified depressive episodes: Secondary | ICD-10-CM | POA: Diagnosis not present

## 2015-09-29 ENCOUNTER — Emergency Department (HOSPITAL_COMMUNITY)
Admission: EM | Admit: 2015-09-29 | Discharge: 2015-09-29 | Disposition: A | Discharge status: Home / Self Care | Payer: 59 | Attending: Emergency Medicine | Admitting: Emergency Medicine

## 2015-09-29 ENCOUNTER — Encounter (HOSPITAL_COMMUNITY): Payer: Self-pay | Admitting: Emergency Medicine

## 2015-09-29 ENCOUNTER — Emergency Department (HOSPITAL_COMMUNITY): Payer: 59

## 2015-09-29 DIAGNOSIS — R1013 Epigastric pain: Secondary | ICD-10-CM | POA: Diagnosis not present

## 2015-09-29 DIAGNOSIS — K2970 Gastritis, unspecified, without bleeding: Secondary | ICD-10-CM | POA: Diagnosis not present

## 2015-09-29 DIAGNOSIS — Z87891 Personal history of nicotine dependence: Secondary | ICD-10-CM | POA: Insufficient documentation

## 2015-09-29 DIAGNOSIS — Z79899 Other long term (current) drug therapy: Secondary | ICD-10-CM | POA: Insufficient documentation

## 2015-09-29 DIAGNOSIS — R109 Unspecified abdominal pain: Secondary | ICD-10-CM | POA: Diagnosis not present

## 2015-09-29 DIAGNOSIS — R1111 Vomiting without nausea: Secondary | ICD-10-CM | POA: Diagnosis not present

## 2015-09-29 DIAGNOSIS — R51 Headache: Secondary | ICD-10-CM | POA: Diagnosis not present

## 2015-09-29 DIAGNOSIS — M542 Cervicalgia: Secondary | ICD-10-CM | POA: Insufficient documentation

## 2015-09-29 LAB — CBC WITH DIFFERENTIAL/PLATELET
BASOS ABS: 0 10*3/uL (ref 0.0–0.1)
BASOS PCT: 0 %
EOS ABS: 0 10*3/uL (ref 0.0–0.7)
EOS PCT: 0 %
HCT: 42.8 % (ref 36.0–46.0)
Hemoglobin: 14.4 g/dL (ref 12.0–15.0)
Lymphocytes Relative: 11 %
Lymphs Abs: 0.6 10*3/uL — ABNORMAL LOW (ref 0.7–4.0)
MCH: 32.4 pg (ref 26.0–34.0)
MCHC: 33.6 g/dL (ref 30.0–36.0)
MCV: 96.4 fL (ref 78.0–100.0)
MONO ABS: 0.1 10*3/uL (ref 0.1–1.0)
Monocytes Relative: 3 %
Neutro Abs: 4.2 10*3/uL (ref 1.7–7.7)
Neutrophils Relative %: 86 %
PLATELETS: 212 10*3/uL (ref 150–400)
RBC: 4.44 MIL/uL (ref 3.87–5.11)
RDW: 12.4 % (ref 11.5–15.5)
WBC: 5 10*3/uL (ref 4.0–10.5)

## 2015-09-29 LAB — COMPREHENSIVE METABOLIC PANEL
ALBUMIN: 4.1 g/dL (ref 3.5–5.0)
ALT: 19 U/L (ref 14–54)
AST: 30 U/L (ref 15–41)
Alkaline Phosphatase: 60 U/L (ref 38–126)
Anion gap: 8 (ref 5–15)
BUN: 11 mg/dL (ref 6–20)
CHLORIDE: 101 mmol/L (ref 101–111)
CO2: 27 mmol/L (ref 22–32)
Calcium: 9 mg/dL (ref 8.9–10.3)
Creatinine, Ser: 0.7 mg/dL (ref 0.44–1.00)
GFR calc Af Amer: >60 mL/min (ref >60)
GLUCOSE: 98 mg/dL (ref 65–99)
POTASSIUM: 3.9 mmol/L (ref 3.5–5.1)
SODIUM: 136 mmol/L (ref 135–145)
Total Bilirubin: 0.8 mg/dL (ref 0.3–1.2)
Total Protein: 7 g/dL (ref 6.5–8.1)

## 2015-09-29 LAB — URINALYSIS, ROUTINE W REFLEX MICROSCOPIC
Bilirubin Urine: NEGATIVE
GLUCOSE, UA: NEGATIVE mg/dL
Hgb urine dipstick: NEGATIVE
Ketones, ur: 15 mg/dL — AB
LEUKOCYTES UA: NEGATIVE
Nitrite: NEGATIVE
PROTEIN: NEGATIVE mg/dL
SPECIFIC GRAVITY, URINE: 1.014 (ref 1.005–1.030)
pH: 7.5 (ref 5.0–8.0)

## 2015-09-29 LAB — LIPASE, BLOOD: LIPASE: 31 U/L (ref 11–51)

## 2015-09-29 MED ORDER — PANTOPRAZOLE SODIUM 20 MG PO TBEC: 20.0000 mg | DELAYED_RELEASE_TABLET | Freq: Every day | ORAL | 0 refills | Status: AC

## 2015-09-29 MED ORDER — ONDANSETRON HCL 4 MG/2ML IJ SOLN
4.0000 mg | Freq: Once | INTRAMUSCULAR | Status: DC
Start: 2015-09-29 — End: 2015-09-29

## 2015-09-29 MED ORDER — LORAZEPAM 2 MG/ML IJ SOLN
1.0000 mg | Freq: Once | INTRAMUSCULAR | Status: AC
  Administered 2015-09-29: 1 mg via INTRAVENOUS
  Filled 2015-09-29: qty 1

## 2015-09-29 MED ORDER — SODIUM CHLORIDE 0.9 % IV BOLUS (SEPSIS)
1000.0000 mL | Freq: Once | INTRAVENOUS | Status: AC
  Administered 2015-09-29: 1000 mL via INTRAVENOUS

## 2015-09-29 MED ORDER — SUCRALFATE 1 G PO TABS
1.0000 g | ORAL_TABLET | Freq: Once | ORAL | Status: AC
Start: 2015-09-29 — End: 2015-09-29
  Administered 2015-09-29: 1 g via ORAL
  Filled 2015-09-29: qty 1

## 2015-09-29 MED ORDER — SODIUM CHLORIDE 0.9 % IV SOLN: INTRAVENOUS | Status: DC

## 2015-09-29 MED ORDER — LORAZEPAM 0.5 MG PO TABS
0.5000 mg | ORAL_TABLET | Freq: Four times a day (QID) | ORAL | 0 refills | Status: AC | PRN
Start: 2015-09-29 — End: ?

## 2015-09-29 MED ORDER — HYDROMORPHONE HCL 1 MG/ML IJ SOLN
1.0000 mg | Freq: Once | INTRAMUSCULAR | Status: AC
  Administered 2015-09-29: 1 mg via INTRAVENOUS
  Filled 2015-09-29: qty 1

## 2015-09-29 MED ORDER — SUCRALFATE 1 G PO TABS: 1.0000 g | ORAL_TABLET | Freq: Four times a day (QID) | ORAL | 0 refills | Status: AC

## 2015-09-29 NOTE — ED Provider Notes (Signed)
[facility] DEPT Provider Note   CSN: [id number] Arrival date & time: 09/29/15  1212  First Provider Contact:  None       History   Chief Complaint Chief Complaint  Patient presents with  . Abdominal Pain  . Neck Pain    HPI Caitlin Page is a 55 y.o. female.  55 year old female presents with headache and right-sided neck pain times several days. Also notes midepigastric abdominal pain similar to her prior pancreatitis. She has had nonbilious vomiting without fever or chills. Headache has been localized to the right side of her head in characterized more as a fullness. She denies any visual changes. Right-sided neck pain is lower mid cervical region does worse with certain movements. The pain does radiate down to her right fourth and fifth digits. No bowel or bladder dysfunction. Abdominal discomfort is midepigastric and does go to the right upper quadrant. Patient states that she did have 3 beers last night. Denies any urinary symptoms.      Past Medical History:  Diagnosis Date  . Colitis 08/20/2014  . IBS (irritable bowel syndrome)     There are no active problems to display for this patient.   Past Surgical History:  Procedure Laterality Date  . LAMINECTOMY    . TONSILLECTOMY    . TUBAL LIGATION      OB History    No data available       Home Medications    Prior to Admission medications   Medication Sig Start Date End Date Taking? Authorizing Provider  acetaminophen (TYLENOL) 500 MG tablet Take 500 mg by mouth every 6 (six) hours as needed for mild pain.   Yes Historical Provider, MD  b complex vitamins tablet Take 1 tablet by mouth daily.   Yes Historical Provider, MD  Biotin 10 MG CAPS Take 10 mg by mouth daily.   Yes Historical Provider, MD  vitamin C (ASCORBIC ACID) 500 MG tablet Take 500 mg by mouth daily.   Yes Historical Provider, MD  ciprofloxacin (CIPRO) 500 MG tablet Take 1 tablet (500 mg total) by mouth 2 (two) times daily. One po bid x 7  days 08/20/14   Mercedes Camprubi-Soms, PA-C  hydrocortisone (ANUSOL-HC) 25 MG suppository Place 1 suppository (25 mg total) rectally 2 (two) times daily. For 7 days 08/20/14   Mercedes Camprubi-Soms, PA-C  metroNIDAZOLE (FLAGYL) 500 MG tablet Take 1 tablet (500 mg total) by mouth 3 (three) times daily. One po tid x 7 days 08/20/14   Mercedes Camprubi-Soms, PA-C    Family History History reviewed. No pertinent family history.  Social History Social History  Substance Use Topics  . Smoking status: Former Research scientist (life sciences)  . Smokeless tobacco: Never Used  . Alcohol use Yes     Allergies   Compazine [prochlorperazine edisylate]   Review of Systems Review of Systems  All other systems reviewed and are negative.    Physical Exam Updated Vital Signs BP 126/63   Pulse 68   Temp 97.7 F (36.5 C) (Oral)   Resp (!) 27   Ht 5\' 9"  (1.753 m)   Wt 71.7 kg   LMP  (LMP Unknown)   SpO2 99%   BMI 23.33 kg/m   Physical Exam  Constitutional: She is oriented to person, place, and time. She appears well-developed and well-nourished.  Non-toxic appearance. No distress.  HENT:  Head: Normocephalic and atraumatic.  Eyes: Conjunctivae, EOM and lids are normal. Pupils are equal, round, and reactive to light.  Neck: Normal range  of motion. Neck supple. No neck rigidity. No tracheal deviation and normal range of motion present. No thyroid mass present.    Cardiovascular: Normal rate, regular rhythm and normal heart sounds.  Exam reveals no gallop.   No murmur heard. Pulmonary/Chest: Effort normal and breath sounds normal. No stridor. No respiratory distress. She has no decreased breath sounds. She has no wheezes. She has no rhonchi. She has no rales.  Abdominal: Soft. Normal appearance and bowel sounds are normal. She exhibits no distension. There is no tenderness. There is no rigidity, no rebound, no guarding and no CVA tenderness.    Musculoskeletal: Normal range of motion. She exhibits no edema or  tenderness.  Neurological: She is alert and oriented to person, place, and time. She has normal strength. No cranial nerve deficit or sensory deficit. GCS eye subscore is 4. GCS verbal subscore is 5. GCS motor subscore is 6.  Skin: Skin is warm and dry. No abrasion and no rash noted.  Psychiatric: She has a normal mood and affect. Her speech is normal and behavior is normal.  Nursing note and vitals reviewed.    ED Treatments / Results  Labs (all labs ordered are listed, but only abnormal results are displayed) Labs Reviewed  URINE CULTURE  CBC WITH DIFFERENTIAL/PLATELET  COMPREHENSIVE METABOLIC PANEL  LIPASE, BLOOD  URINALYSIS, ROUTINE W REFLEX MICROSCOPIC (NOT AT [facility])    EKG  EKG Interpretation  Date/Time:  Sunday September 29 2015 12:23:25 EDT Ventricular Rate:  70 PR Interval:    QRS Duration: 87 QT Interval:  421 QTC Calculation: 455 R Axis:   81 Text Interpretation:  Sinus rhythm Prolonged PR interval Confirmed by Zenia Resides  MD, Kelliann Pendergraph ([id number]) on 09/29/2015 12:55:42 PM       Radiology No results found.  Procedures Procedures (including critical care time)  Medications Ordered in ED Medications  0.9 %  sodium chloride infusion (not administered)  LORazepam (ATIVAN) injection 1 mg (not administered)  sodium chloride 0.9 % bolus 1,000 mL (1,000 mLs Intravenous New Bag/Given 09/29/15 1248)  HYDROmorphone (DILAUDID) injection 1 mg (1 mg Intravenous Given 09/29/15 1248)     Initial Impression / Assessment and Plan / ED Course  I have reviewed the triage vital signs and the nursing notes.  Pertinent labs & imaging results that were available during my care of the patient were reviewed by me and considered in my medical decision making (see chart for details).  Clinical Course  Value Comment By Time  EKG 12-Lead (Reviewed) Lacretia Leigh, MD 08/13 1442  EKG 12-Lead (Reviewed) Lacretia Leigh, MD 08/13 1442    Patient medicated and feels better here. Head CT and neck CT  reviewed with patient and were without acute findings except for some DJD in her cervical spine which could explain her right upper extremity numbness and paresthesias. She has no appreciable weakness on a physical exam. Medicated for nausea feels better. Repeat abdominal exam remains benign and patient stable for discharge on Carafate and PPI  Final Clinical Impressions(s) / ED Diagnoses   Final diagnoses:  None    New Prescriptions New Prescriptions   No medications on file     Lacretia Leigh, MD 09/29/15 1444

## 2015-09-29 NOTE — ED Notes (Signed)
Pt received 8mg  zofran and 120mcg fentanyl PTA.

## 2015-09-29 NOTE — ED Triage Notes (Signed)
Pt here with severe epigastric pain with vomiting starting this morning and severe neck pain starting 4 days ago. Pt denies fever at home, recent tick bites. Pt has hx of pancreatic lesion last July.

## 2015-09-30 LAB — URINE CULTURE

## 2015-10-02 DIAGNOSIS — R140 Abdominal distension (gaseous): Secondary | ICD-10-CM | POA: Diagnosis not present

## 2015-10-02 DIAGNOSIS — K5900 Constipation, unspecified: Secondary | ICD-10-CM | POA: Diagnosis not present

## 2015-10-02 DIAGNOSIS — R109 Unspecified abdominal pain: Secondary | ICD-10-CM | POA: Diagnosis not present

## 2015-10-02 DIAGNOSIS — K862 Cyst of pancreas: Secondary | ICD-10-CM | POA: Diagnosis not present

## 2015-10-08 DIAGNOSIS — M7551 Bursitis of right shoulder: Secondary | ICD-10-CM | POA: Diagnosis not present

## 2015-10-08 DIAGNOSIS — M25511 Pain in right shoulder: Secondary | ICD-10-CM | POA: Diagnosis not present

## 2015-10-14 DIAGNOSIS — F3289 Other specified depressive episodes: Secondary | ICD-10-CM | POA: Diagnosis not present

## 2015-11-12 DIAGNOSIS — F3289 Other specified depressive episodes: Secondary | ICD-10-CM | POA: Diagnosis not present

## 2015-12-03 DIAGNOSIS — F3289 Other specified depressive episodes: Secondary | ICD-10-CM | POA: Diagnosis not present

## 2016-03-18 ENCOUNTER — Other Ambulatory Visit: Payer: Self-pay | Admitting: Gastroenterology

## 2016-03-18 DIAGNOSIS — K862 Cyst of pancreas: Secondary | ICD-10-CM

## 2017-09-22 IMAGING — MR MR ABDOMEN WO/W CM
18 of 19 series · 43 of 48 positions shown · IV contrast (multihance)
Comparison: 08/20/2014

CLINICAL DATA: Abdominal cramping for the past 6 months. Pancreatic
lesion on CT.

EXAM:
MRI ABDOMEN WITHOUT AND WITH CONTRAST
TECHNIQUE: Multiplanar multisequence MR imaging of the abdomen was performed
both before and after the administration of intravenous contrast.
CONTRAST:  14mL MULTIHANCE GADOBENATE DIMEGLUMINE 529 MG/ML IV SOLN

[Series 3: T2 · coronal · 5.0mm · 1.56mm/px · 1 of 36 slices shown (1 of 4)]
[im 1/36]
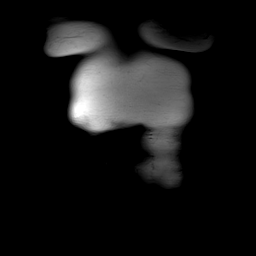

[Series 4: T1 · axial · 3.0mm · 1.19mm/px · z∈[-132,+105]mm · 5 of 160 slices shown]
[im 1/160]
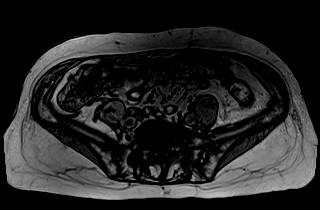
[im 40/160]
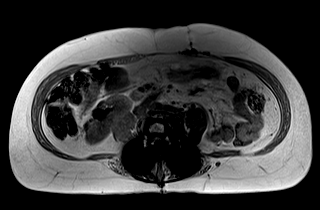
[im 80/160]
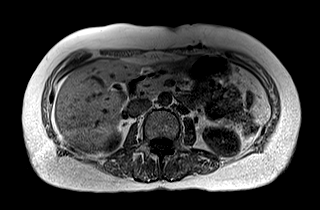
[im 120/160]
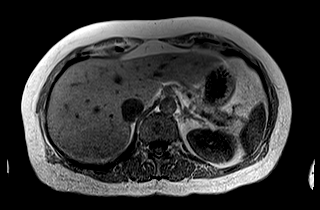
[im 160/160]
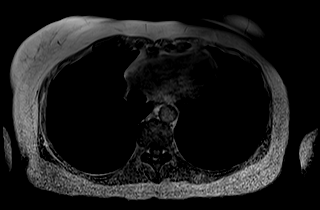

[Series 5: T2 · axial · 5.0mm · 1.48mm/px · 1 of 43 slices shown (2 of 4)]
[im 1/43]
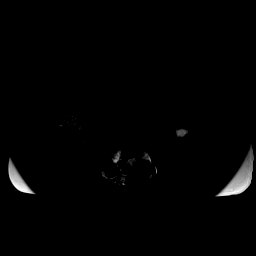

[Series 7: DWI · axial · 5.0mm · 1.42mm/px · z∈[-110,+142]mm · 4 of 129 slices shown (1 of 2)]
[im 1/129]
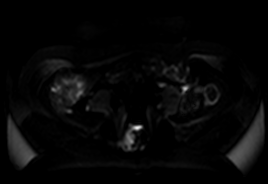
[im 43/129]
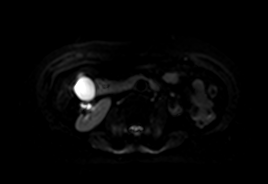
[im 86/129]
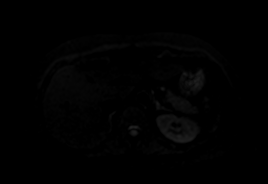
[im 129/129]
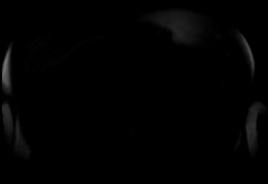

[Series 8: DWI · axial · 5.0mm · 1.42mm/px · 1 of 43 slices shown (2 of 2)]
[im 1/43]
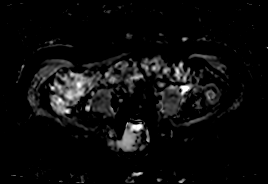

[Series 11: MRCP · coronal · 1.0mm · 0.49mm/px · 3 of 72 slices shown]
[im 1/72]
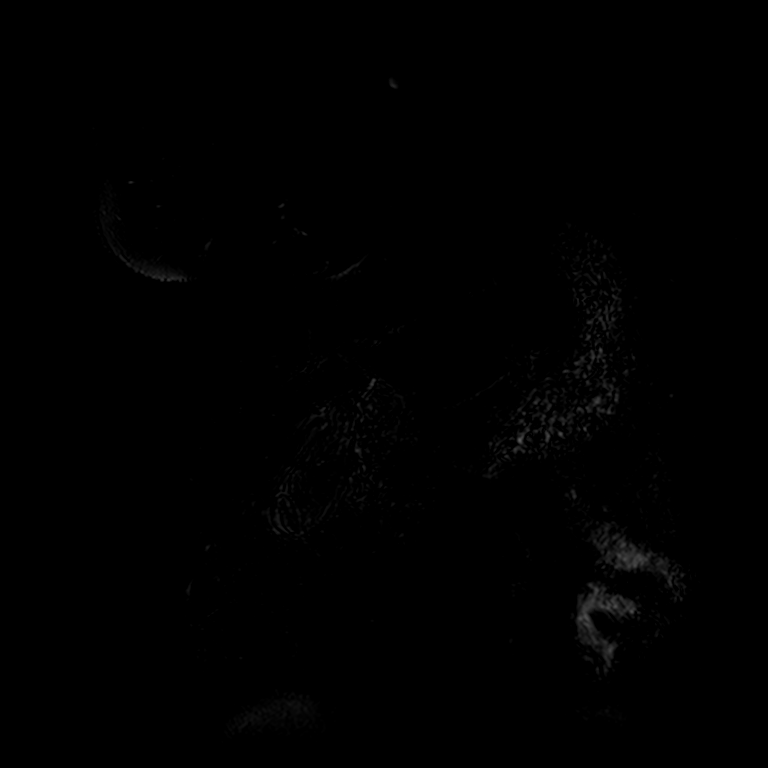
[im 36/72]
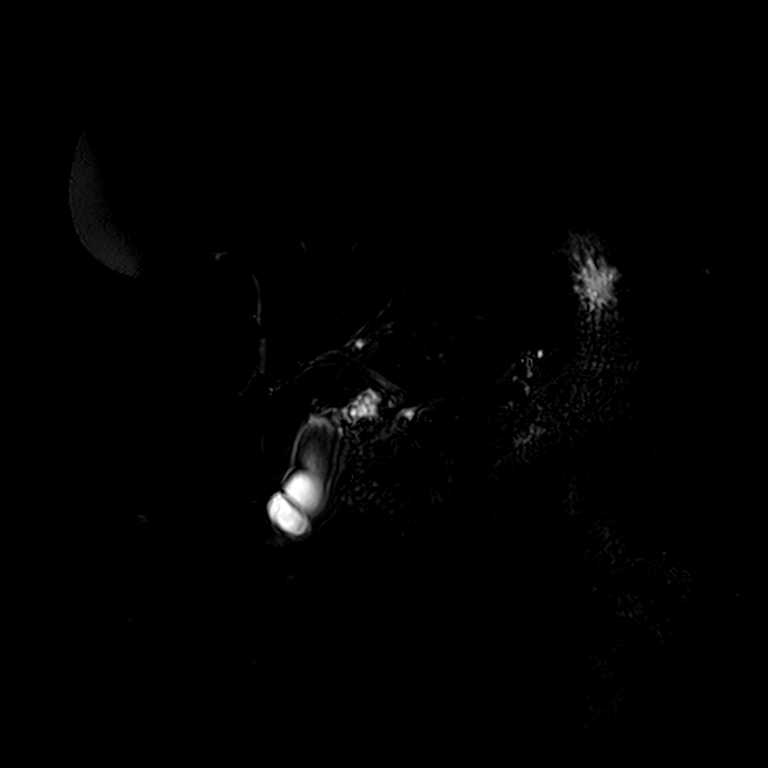
[im 72/72]
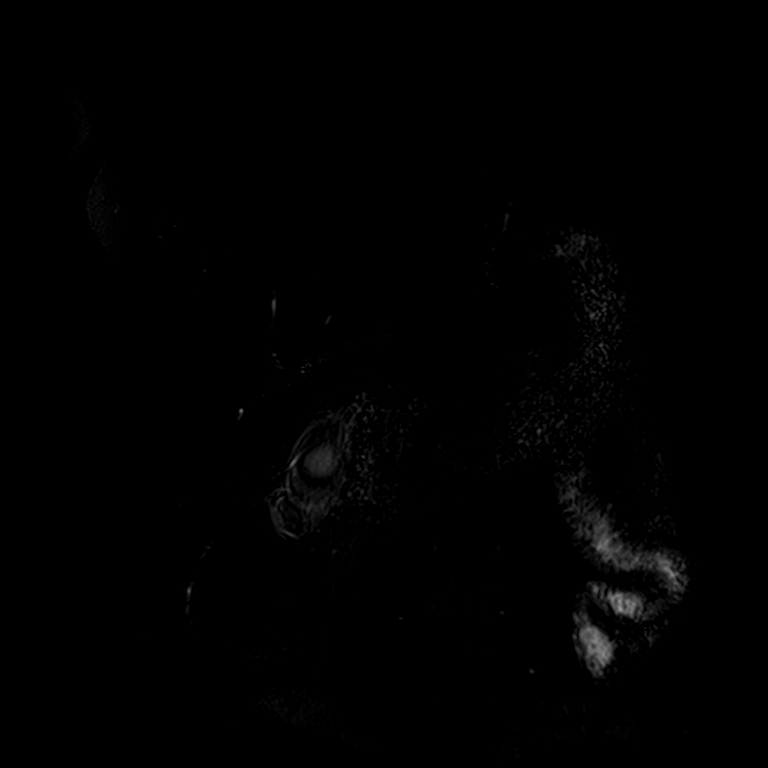

[Series 13: T2 · axial · 6.0mm · 1.22mm/px · 1 of 33 slices shown (3 of 4)]
[im 1/33]
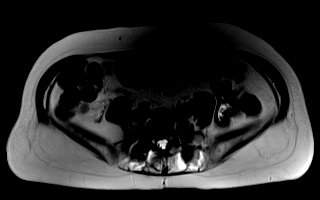

[Series 14: T2 · coronal · 3.0mm · 1.19mm/px · 1 of 19 slices shown (4 of 4)]
[im 1/19]
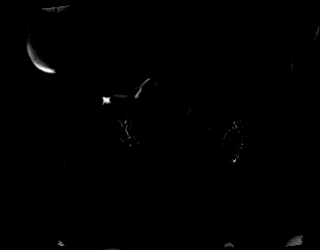

[Series 15: bSSFP · axial · 5.0mm · 1.25mm/px · 1 of 40 slices shown]
[im 1/40]
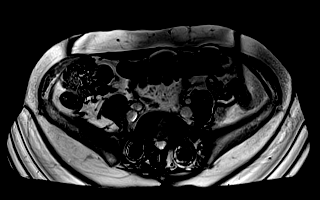

[Series 16: T1 dynamic · axial · non-contrast · 3.0mm · 1.25mm/px · z∈[-130,+107]mm · 3 of 80 slices shown]
[im 1/80]
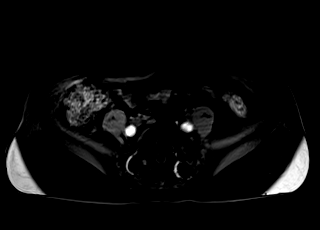
[im 40/80]
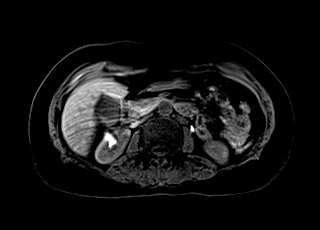
[im 80/80]
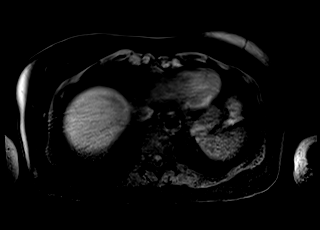

[Series 17: T1 dynamic post-contrast · axial · 3.0mm · 1.25mm/px · z∈[-130,+107]mm · 3 of 80 slices shown (1 of 8)]
[im 1/80]
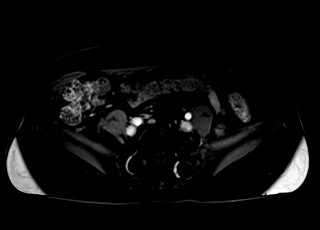
[im 40/80]
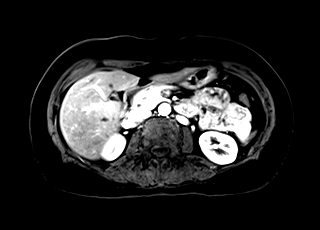
[im 80/80]
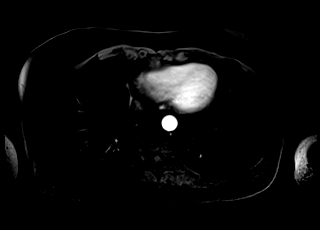

[Series 18: T1 dynamic post-contrast · axial · 3.0mm · 1.25mm/px · z∈[-130,+107]mm · 3 of 80 slices shown (2 of 8)]
[im 1/80]
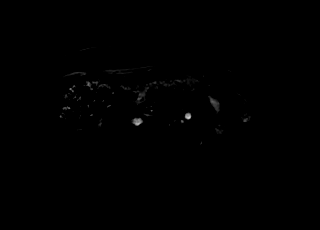
[im 40/80]
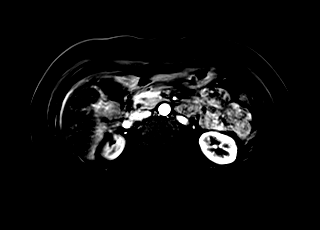
[im 80/80]
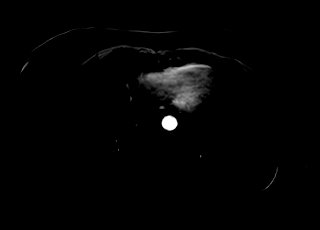

[Series 19: T1 dynamic post-contrast · axial · 3.0mm · 1.25mm/px · z∈[-130,+107]mm · 3 of 80 slices shown (3 of 8)]
[im 1/80]
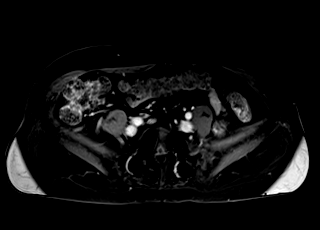
[im 40/80]
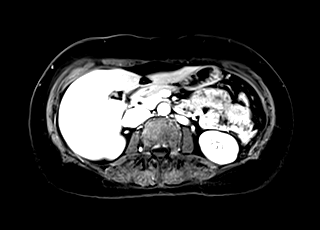
[im 80/80]
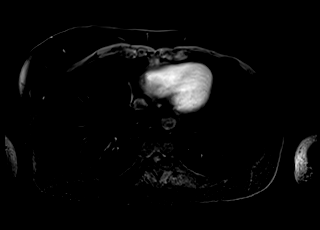

[Series 20: T1 dynamic post-contrast · axial · 3.0mm · 1.25mm/px · z∈[-130,+107]mm · 3 of 80 slices shown (4 of 8)]
[im 1/80]
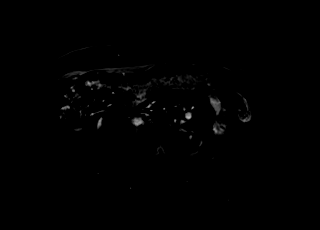
[im 40/80]
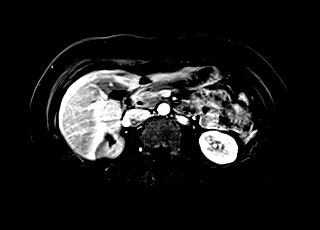
[im 80/80]
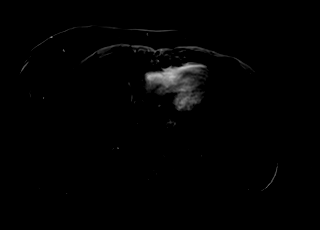

[Series 21: T1 dynamic post-contrast · axial · 3.0mm · 1.25mm/px · z∈[-130,+107]mm · 3 of 80 slices shown (5 of 8)]
[im 1/80]
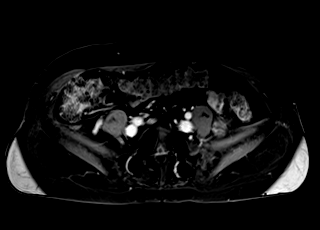
[im 40/80]
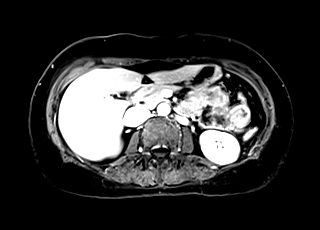
[im 80/80]
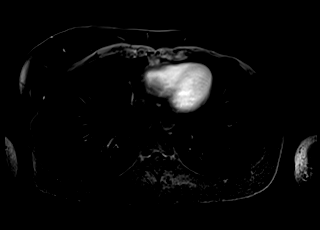

[Series 22: T1 dynamic post-contrast · axial · 3.0mm · 1.25mm/px · z∈[-130,+107]mm · 3 of 80 slices shown (6 of 8)]
[im 1/80]
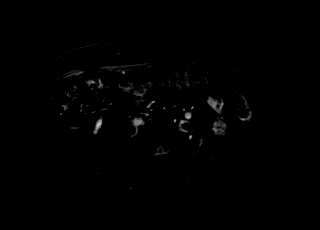
[im 40/80]
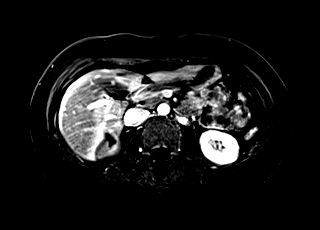
[im 80/80]
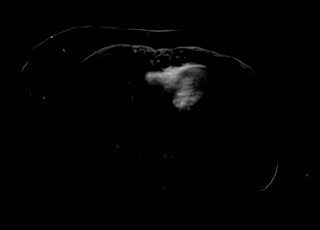

[Series 23: T1 dynamic post-contrast · coronal · 3.0mm · 1.25mm/px · 3 of 72 slices shown (7 of 8)]
[im 1/72]
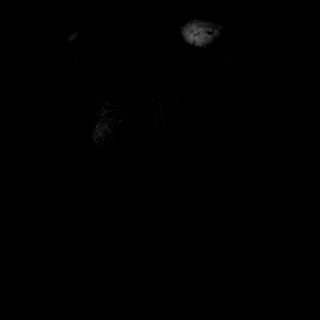
[im 36/72]
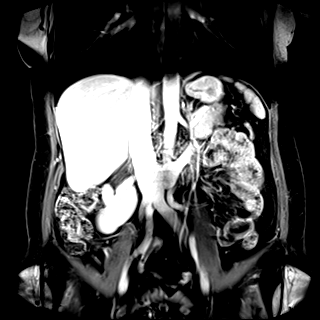
[im 72/72]
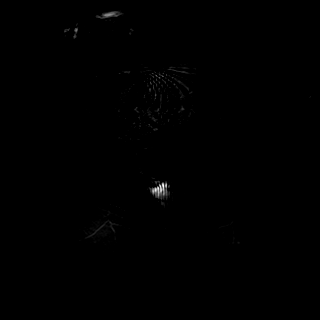

[Series 24: T1 dynamic post-contrast · axial · 3.0mm · 1.25mm/px · 1 of 80 slices shown (8 of 8)]
[im 1/80]
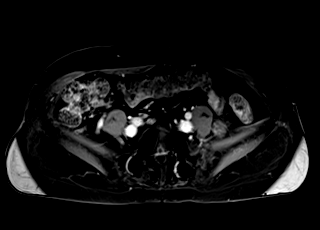

[43 of 48 positions shown; findings below may reference images not displayed]

FINDINGS: Lower chest:  Unremarkable

Hepatobiliary: 7 mm T2 hyperintense lesion in segment 7, no
enhancement, representing simple cyst.

Pancreas: The lesion of concern posteriorly at the junction of the
pancreatic body and tail measures 1.1 by 0.8 by 0.6 cm and has high
T2 and low T1 signal and no associated enhancement.

Dorsal pancreatic duct currently appears normal aside from the
presence of pancreas divisum. No abnormal pancreatic parenchymal
enhancement

Spleen: Unremarkable

Adrenals/Urinary Tract: Non rotated right kidney. Adrenal glands
normal.

Stomach/Bowel: Unremarkable. No persistent abnormality of the
descending colon is observed.

Vascular/Lymphatic: Unremarkable

Other: No supplemental non-categorized findings.

Musculoskeletal: Lower lumbar posterolateral rod and pedicle screw
fixation.
IMPRESSION: 1. Average size 8 mm cystic lesion at the junction of the pancreatic
body and tail, no change from 08/20/2014. This documents 6 months of
lack of change. Current guidelines call for a follow up MRI 1 year
from discovery of such a lesion in order to ensure stability over a
12 month time peroid. Given that we have now documented 6 months of
stability, in order to comply with current guidelines I recommend a
follow up MRI using pancreatic protocol in 6-12 months time to
ensure lack of change.
2. Small simple cyst posteriorly in the right hepatic lobe.
3. Non rotated right kidney
